# Patient Record
Sex: Female | Born: 1995 | Race: Black or African American | Hispanic: No | Marital: Single
Health system: Southern US, Community
[De-identification: ages and names within clinical notes are randomized; demographics above are authoritative.]

---
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
## 2018-10-23 ENCOUNTER — Emergency Department (HOSPITAL_BASED_OUTPATIENT_CLINIC_OR_DEPARTMENT_OTHER): Payer: 59

## 2018-10-23 ENCOUNTER — Emergency Department (HOSPITAL_BASED_OUTPATIENT_CLINIC_OR_DEPARTMENT_OTHER)
Admission: EM | Admit: 2018-10-23 | Discharge: 2018-10-23 | Disposition: A | Discharge status: Home / Self Care | Payer: 59 | Attending: Emergency Medicine | Admitting: Emergency Medicine

## 2018-10-23 ENCOUNTER — Encounter (HOSPITAL_BASED_OUTPATIENT_CLINIC_OR_DEPARTMENT_OTHER): Payer: Self-pay | Admitting: Emergency Medicine

## 2018-10-23 DIAGNOSIS — E860 Dehydration: Secondary | ICD-10-CM | POA: Diagnosis not present

## 2018-10-23 DIAGNOSIS — E876 Hypokalemia: Secondary | ICD-10-CM | POA: Diagnosis not present

## 2018-10-23 DIAGNOSIS — N10 Acute tubulo-interstitial nephritis: Secondary | ICD-10-CM | POA: Insufficient documentation

## 2018-10-23 DIAGNOSIS — R509 Fever, unspecified: Secondary | ICD-10-CM | POA: Diagnosis present

## 2018-10-23 DIAGNOSIS — N12 Tubulo-interstitial nephritis, not specified as acute or chronic: Secondary | ICD-10-CM

## 2018-10-23 DIAGNOSIS — R112 Nausea with vomiting, unspecified: Secondary | ICD-10-CM | POA: Diagnosis not present

## 2018-10-23 LAB — COMPREHENSIVE METABOLIC PANEL
ALT: 27 U/L (ref 0–44)
AST: 22 U/L (ref 15–41)
Albumin: 2.8 g/dL — ABNORMAL LOW (ref 3.5–5.0)
Alkaline Phosphatase: 94 U/L (ref 38–126)
Anion gap: 16 — ABNORMAL HIGH (ref 5–15)
BUN: 16 mg/dL (ref 6–20)
CO2: 26 mmol/L (ref 22–32)
Calcium: 9 mg/dL (ref 8.9–10.3)
Chloride: 91 mmol/L — ABNORMAL LOW (ref 98–111)
Creatinine, Ser: 1.32 mg/dL — ABNORMAL HIGH (ref 0.44–1.00)
GFR calc Af Amer: >60 mL/min (ref >60)
GFR calc non Af Amer: 58 mL/min — ABNORMAL LOW (ref >60)
Glucose, Bld: 137 mg/dL — ABNORMAL HIGH (ref 70–99)
Potassium: 2.9 mmol/L — ABNORMAL LOW (ref 3.5–5.1)
Sodium: 133 mmol/L — ABNORMAL LOW (ref 135–145)
Total Bilirubin: 1.9 mg/dL — ABNORMAL HIGH (ref 0.3–1.2)
Total Protein: 8 g/dL (ref 6.5–8.1)

## 2018-10-23 LAB — URINALYSIS, ROUTINE W REFLEX MICROSCOPIC
Bilirubin Urine: NEGATIVE
Glucose, UA: NEGATIVE mg/dL
Ketones, ur: 15 mg/dL — AB
Nitrite: POSITIVE — AB
Protein, ur: NEGATIVE mg/dL
Specific Gravity, Urine: <1.005 — ABNORMAL LOW (ref 1.005–1.030)
pH: 6.5 (ref 5.0–8.0)

## 2018-10-23 LAB — CBC WITH DIFFERENTIAL/PLATELET
Abs Immature Granulocytes: 2.16 10*3/uL — ABNORMAL HIGH (ref 0.00–0.07)
Basophils Absolute: 0.2 10*3/uL — ABNORMAL HIGH (ref 0.0–0.1)
Basophils Relative: 1 %
Eosinophils Absolute: 0 10*3/uL (ref 0.0–0.5)
Eosinophils Relative: 0 %
HCT: 33.9 % — ABNORMAL LOW (ref 36.0–46.0)
Hemoglobin: 11.3 g/dL — ABNORMAL LOW (ref 12.0–15.0)
Immature Granulocytes: 6 %
Lymphocytes Relative: 6 %
Lymphs Abs: 2.1 10*3/uL (ref 0.7–4.0)
MCH: 33.1 pg (ref 26.0–34.0)
MCHC: 33.3 g/dL (ref 30.0–36.0)
MCV: 99.4 fL (ref 80.0–100.0)
Monocytes Absolute: 4 10*3/uL — ABNORMAL HIGH (ref 0.1–1.0)
Monocytes Relative: 11 %
Neutro Abs: 28.3 10*3/uL — ABNORMAL HIGH (ref 1.7–7.7)
Neutrophils Relative %: 76 %
Platelets: 332 10*3/uL (ref 150–400)
RBC: 3.41 MIL/uL — ABNORMAL LOW (ref 3.87–5.11)
RDW: 11.8 % (ref 11.5–15.5)
Smear Review: NORMAL
WBC: 36.8 10*3/uL — ABNORMAL HIGH (ref 4.0–10.5)
nRBC: 0 % (ref 0.0–0.2)

## 2018-10-23 LAB — URINALYSIS, MICROSCOPIC (REFLEX)

## 2018-10-23 LAB — PREGNANCY, URINE: Preg Test, Ur: NEGATIVE

## 2018-10-23 LAB — MAGNESIUM: Magnesium: 2.1 mg/dL (ref 1.7–2.4)

## 2018-10-23 MED ORDER — ONDANSETRON 4 MG PO TBDP: 4.0000 mg | ORAL_TABLET | Freq: Three times a day (TID) | ORAL | 0 refills | Status: AC | PRN

## 2018-10-23 MED ORDER — SODIUM CHLORIDE 0.9% FLUSH
3.0000 mL | Freq: Once | INTRAVENOUS | Status: DC
  Filled 2018-10-23: qty 3

## 2018-10-23 MED ORDER — SULFAMETHOXAZOLE-TRIMETHOPRIM 800-160 MG PO TABS: 1.0000 | ORAL_TABLET | Freq: Two times a day (BID) | ORAL | 0 refills | Status: AC

## 2018-10-23 MED ORDER — ONDANSETRON HCL 4 MG/2ML IJ SOLN
INTRAMUSCULAR | Status: AC
  Administered 2018-10-23: 4 mg via INTRAVENOUS
  Filled 2018-10-23: qty 2

## 2018-10-23 MED ORDER — POTASSIUM CHLORIDE 10 MEQ/100ML IV SOLN
10.0000 meq | Freq: Once | INTRAVENOUS | Status: AC
Start: 2018-10-23 — End: 2018-10-23
  Administered 2018-10-23: 10 meq via INTRAVENOUS
  Filled 2018-10-23: qty 100

## 2018-10-23 MED ORDER — ACETAMINOPHEN 500 MG PO TABS
1000.0000 mg | ORAL_TABLET | Freq: Once | ORAL | Status: AC
  Administered 2018-10-23: 1000 mg via ORAL
  Filled 2018-10-23: qty 2

## 2018-10-23 MED ORDER — SODIUM CHLORIDE 0.9 % IV BOLUS
1000.0000 mL | Freq: Once | INTRAVENOUS | Status: AC
  Administered 2018-10-23: 07:00:00 1000 mL via INTRAVENOUS

## 2018-10-23 MED ORDER — ONDANSETRON HCL 4 MG/2ML IJ SOLN
4.0000 mg | Freq: Once | INTRAMUSCULAR | Status: AC
  Administered 2018-10-23: 07:00:00 4 mg via INTRAVENOUS

## 2018-10-23 MED ORDER — SODIUM CHLORIDE 0.9 % IV SOLN
1.0000 g | Freq: Once | INTRAVENOUS | Status: AC
  Administered 2018-10-23: 1 g via INTRAVENOUS
  Filled 2018-10-23: qty 10

## 2018-10-23 MED ORDER — SODIUM CHLORIDE 0.9 % IV BOLUS
1000.0000 mL | Freq: Once | INTRAVENOUS | Status: AC
  Administered 2018-10-23: 10:00:00 1000 mL via INTRAVENOUS

## 2018-10-23 MED ORDER — IOHEXOL 300 MG/ML  SOLN
100.0000 mL | Freq: Once | INTRAMUSCULAR | Status: AC | PRN
  Administered 2018-10-23: 08:00:00 100 mL via INTRAVENOUS

## 2018-10-23 NOTE — ED Notes (Signed)
Patient transported to X-ray 

## 2018-10-23 NOTE — ED Provider Notes (Signed)
[facility] Provider Note: Georgena Spurling, MD, FACEP  CSN: [id number] MRN: [id number] ARRIVAL: 10/23/18 at [facility]: MHOTF/OTF   CHIEF COMPLAINT  Generalized Body Aches   HISTORY OF PRESENT ILLNESS  10/23/18 6:45 AM Cheryl Ruiz is a 23 y.o. female who has been sick with fever (as high as 102.7 here), chills but, generalized body aches, nausea and vomiting for 9 days.  She has been treating her fever with Tylenol but has had difficulty keeping it down.  Her generalized body aches are most prominent right now in the right flank.  She rates his pain as a 9 out of 10.  It is not worse with movement or palpation.  She denies nasal congestion, sore throat, cough, shortness of breath, abdominal pain or diarrhea.  She was tested for COVID-19 in the past week and was reportedly negative.    History reviewed. No pertinent past medical history.  History reviewed. No pertinent surgical history.  History reviewed. No pertinent family history.  Social History   Tobacco Use   Smoking status: Passive Smoke Exposure - Never Smoker   Smokeless tobacco: Never Used  Substance Use Topics   Alcohol use: Yes    Comment: occ    Drug use: Yes    Types: Marijuana    Prior to Admission medications   Medication Sig Start Date End Date Taking? Authorizing Provider  ibuprofen (ADVIL,MOTRIN) 400 MG tablet Take 1 tablet (400 mg total) by mouth every 6 (six) hours as needed. 05/31/15   Dorie Rank, MD  ondansetron (ZOFRAN ODT) 4 MG disintegrating tablet Take 1 tablet (4 mg total) by mouth every 8 (eight) hours as needed. 10/23/18   Isla Pence, MD  sulfamethoxazole-trimethoprim (BACTRIM DS) 800-160 MG tablet Take 1 tablet by mouth 2 (two) times daily for 7 days. 10/23/18 10/30/18  Isla Pence, MD    Allergies Patient has no known allergies.   REVIEW OF SYSTEMS  Negative except as noted here or in the History of Present Illness.   PHYSICAL EXAMINATION  Initial Vital Signs Blood  pressure 114/70, pulse (!) 125, temperature (!) 102.7 F (39.3 C), temperature source Oral, resp. rate 18, height 5\' 7"  (1.702 m), weight 66.7 kg, last menstrual period 10/09/2018, SpO2 97 %.  Examination General: Well-developed, well-nourished female in no acute distress; appearance consistent with age of record HENT: normocephalic; atraumatic Eyes: pupils equal, round and reactive to light; extraocular muscles intact Neck: supple Heart: regular rate and rhythm; tachycardia Lungs: clear to auscultation bilaterally Abdomen: soft; nondistended; nontender; no masses or hepatosplenomegaly; bowel sounds present GU: No CVA tenderness Extremities: No deformity; full range of motion; pulses normal Neurologic: Awake, alert and oriented; motor function intact in all extremities and symmetric; no facial droop Skin: Warm and dry Psychiatric: Normal mood and affect   RESULTS  Summary of this visit's results, reviewed by myself:   EKG Interpretation  Date/Time:    Ventricular Rate:    PR Interval:    QRS Duration:   QT Interval:    QTC Calculation:   R Axis:     Text Interpretation:        Laboratory Studies: Results for orders placed or performed during the hospital encounter of 10/23/18 (from the past 24 hour(s))  Comprehensive metabolic panel     Status: Abnormal   Collection Time: 10/23/18  6:38 AM  Result Value Ref Range   Sodium 133 (L) 135 - 145 mmol/L   Potassium 2.9 (L) 3.5 - 5.1 mmol/L   Chloride  91 (L) 98 - 111 mmol/L   CO2 26 22 - 32 mmol/L   Glucose, Bld 137 (H) 70 - 99 mg/dL   BUN 16 6 - 20 mg/dL   Creatinine, Ser 1.27 (H) 0.44 - 1.00 mg/dL   Calcium 9.0 8.9 - 51.7 mg/dL   Total Protein 8.0 6.5 - 8.1 g/dL   Albumin 2.8 (L) 3.5 - 5.0 g/dL   AST 22 15 - 41 U/L   ALT 27 0 - 44 U/L   Alkaline Phosphatase 94 38 - 126 U/L   Total Bilirubin 1.9 (H) 0.3 - 1.2 mg/dL   GFR calc non Af Amer 58 (L) >60 mL/min   GFR calc Af Amer >60 >60 mL/min   Anion gap 16 (H) 5 - 15    CBC with Differential     Status: Abnormal   Collection Time: 10/23/18  6:38 AM  Result Value Ref Range   WBC 36.8 (H) 4.0 - 10.5 K/uL   RBC 3.41 (L) 3.87 - 5.11 MIL/uL   Hemoglobin 11.3 (L) 12.0 - 15.0 g/dL   HCT 00.1 (L) 74.9 - 44.9 %   MCV 99.4 80.0 - 100.0 fL   MCH 33.1 26.0 - 34.0 pg   MCHC 33.3 30.0 - 36.0 g/dL   RDW 67.5 91.6 - 38.4 %   Platelets 332 150 - 400 K/uL   nRBC 0.0 0.0 - 0.2 %   Neutrophils Relative % 76 %   Neutro Abs 28.3 (H) 1.7 - 7.7 K/uL   Lymphocytes Relative 6 %   Lymphs Abs 2.1 0.7 - 4.0 K/uL   Monocytes Relative 11 %   Monocytes Absolute 4.0 (H) 0.1 - 1.0 K/uL   Eosinophils Relative 0 %   Eosinophils Absolute 0.0 0.0 - 0.5 K/uL   Basophils Relative 1 %   Basophils Absolute 0.2 (H) 0.0 - 0.1 K/uL   RBC Morphology MORPHOLOGY UNREMARKABLE    Smear Review Normal platelet morphology    Immature Granulocytes 6 %   Abs Immature Granulocytes 2.16 (H) 0.00 - 0.07 K/uL  Magnesium     Status: None   Collection Time: 10/23/18  6:38 AM  Result Value Ref Range   Magnesium 2.1 1.7 - 2.4 mg/dL  Urinalysis, Routine w reflex microscopic     Status: Abnormal   Collection Time: 10/23/18  9:30 AM  Result Value Ref Range   Color, Urine YELLOW YELLOW   APPearance HAZY (A) CLEAR   Specific Gravity, Urine <1.005 (L) 1.005 - 1.030   pH 6.5 5.0 - 8.0   Glucose, UA NEGATIVE NEGATIVE mg/dL   Hgb urine dipstick MODERATE (A) NEGATIVE   Bilirubin Urine NEGATIVE NEGATIVE   Ketones, ur 15 (A) NEGATIVE mg/dL   Protein, ur NEGATIVE NEGATIVE mg/dL   Nitrite POSITIVE (A) NEGATIVE   Leukocytes,Ua TRACE (A) NEGATIVE  Pregnancy, urine     Status: None   Collection Time: 10/23/18  9:30 AM  Result Value Ref Range   Preg Test, Ur NEGATIVE NEGATIVE  Urinalysis, Microscopic (reflex)     Status: Abnormal   Collection Time: 10/23/18  9:30 AM  Result Value Ref Range   RBC / HPF 0-5 0 - 5 RBC/hpf   WBC, UA 21-50 0 - 5 WBC/hpf   Bacteria, UA MANY (A) NONE SEEN   Squamous Epithelial /  LPF 0-5 0 - 5   WBC Clumps PRESENT    Imaging Studies: Dg Chest 2 View  Result Date: 10/23/2018 CLINICAL DATA:  Generalized body aches and fever.  Chills. EXAM: CHEST - 2 VIEW COMPARISON:  None. FINDINGS: The heart size and mediastinal contours are within normal limits. Both lungs are clear. The visualized skeletal structures are unremarkable. IMPRESSION: Negative two view chest x-ray Electronically Signed   By: Marin Robertshristopher  Mattern M.D.   On: 10/23/2018 07:41   Ct Abdomen Pelvis W Contrast  Result Date: 10/23/2018 CLINICAL DATA:  Diffuse abdominal pain, chills, and vomiting for 1 week. EXAM: CT ABDOMEN AND PELVIS WITH CONTRAST TECHNIQUE: Multidetector CT imaging of the abdomen and pelvis was performed using the standard protocol following bolus administration of intravenous contrast. CONTRAST:  100mL OMNIPAQUE IOHEXOL 300 MG/ML  SOLN COMPARISON:  None. FINDINGS: Lower Chest: No acute findings. Hepatobiliary: A few tiny subcentimeter low attention lesions are too small to characterize, but most likely represent tiny cysts. No definite liver masses are identified. Gallbladder is unremarkable. No evidence of biliary ductal dilatation. Pancreas:  No mass or inflammatory changes. Spleen: Within normal limits in size and appearance. Adrenals/Urinary Tract: Multiple poorly defined areas of decreased parenchymal enhancement are seen involving the right kidney greater than left, with associated perinephric inflammatory changes. These findings are consistent with bilateral pyelonephritis. A few small renal cysts are noted bilaterally, however there is no definite evidence of renal abscess. No evidence of hydronephrosis. Unremarkable unopacified urinary bladder. Stomach/Bowel: No evidence of obstruction, inflammatory process or abnormal fluid collections. Vascular/Lymphatic: No pathologically enlarged lymph nodes. No abdominal aortic aneurysm. Reproductive:  No mass or other significant abnormality. Other:  None.  Musculoskeletal:  No suspicious bone lesions identified. IMPRESSION: Bilateral pyelonephritis, right side greater than left. No evidence of hydronephrosis or abscess. Electronically Signed   By: Danae OrleansJohn A Stahl M.D.   On: 10/23/2018 09:05    ED COURSE and MDM  Nursing notes and initial vitals signs, including pulse oximetry, reviewed.  Vitals:   10/23/18 0619 10/23/18 0621 10/23/18 1044  BP: 114/70  100/65  Pulse: (!) 125  90  Resp: 18  18  Temp: (!) 102.7 F (39.3 C)  98.6 F (37 C)  TempSrc: Oral  Oral  SpO2: 97%  99%  Weight:  66.7 kg   Height:  5\' 7"  (1.702 m)    6:52 AM IV fluid bolus started.  Zofran IV and acetaminophen orally ordered.  7:00 AM Signed out to Dr. Con MemosHavilland.  PROCEDURES    ED DIAGNOSES     ICD-10-CM   1. Fever in adult  R50.9   2. Pyelonephritis  N12   3. Dehydration  E86.0   4. Non-intractable vomiting with nausea, unspecified vomiting type  R11.2   5. Hypokalemia  E87.6        Stefen Juba, Jonny RuizJohn, MD 10/23/18 2236

## 2018-10-23 NOTE — ED Triage Notes (Signed)
Pt is c/o generalized body aches and vomiting that started last Monday  Pt states she is also having chills  Pt states she has already been tested for COVID and the results were negative  Pt states her back on the right side is what is hurting her most now

## 2018-10-23 NOTE — ED Notes (Signed)
Patient transported to CT 

## 2018-10-23 NOTE — ED Provider Notes (Signed)
Pt signed out by Dr. Florina Ou pending labs and work up.  Pt has an elevated WBC count with right flank pain, so I ordered a CT.  IMPRESSION:  Bilateral pyelonephritis, right side greater than left. No evidence  of hydronephrosis or abscess.     Pt given IV rocephin for the pyelo.  Pt also hypokalemic, so pt given 10 meq Kcl IV.    She is feeling much better after 2L and meds.  She looks great.  No signs of sepsis.  Urine sent for cx.  She is able to tolerate po fluids.  She will be d/c home with zofran and bactrim.  She is told to alternate tylenol/ibuprofen for fever.   She knows to return if worse.   Isla Pence, MD 10/23/18 1046

## 2018-10-25 LAB — URINE CULTURE: Culture: >=100000 — AB

## 2018-10-26 ENCOUNTER — Telehealth: Payer: Self-pay

## 2018-10-26 NOTE — Telephone Encounter (Signed)
Post ED Visit - Positive Culture Follow-up  Culture report reviewed by antimicrobial stewardship pharmacist: [facility] Team []  Elenor Quinones, Pharm.D. []  Heide Guile, Pharm.D., BCPS AQ-ID []  Parks Neptune, Pharm.D., BCPS []  Alycia Rossetti, Pharm.D., BCPS []  [place], [place].D., BCPS, AAHIVP []  Legrand Como, Pharm.D., BCPS, AAHIVP []  Salome Arnt, PharmD, BCPS []  Johnnette Gourd, PharmD, BCPS [x]  Hughes Better, PharmD, BCPS []  Leeroy Cha, PharmD []  Laqueta Linden, PharmD, BCPS []  Albertina Parr, PharmD  [place] Team []  Leodis Sias, PharmD []  Lindell Spar, PharmD []  Royetta Asal, PharmD []  Graylin Shiver, Rph []  Rema Fendt) Glennon Mac, PharmD []  Arlyn Dunning, PharmD []  Netta Cedars, PharmD []  Dia Sitter, PharmD []  Leone Haven, PharmD []  Gretta Arab, PharmD []  Theodis Shove, PharmD []  Peggyann Juba, PharmD []  Reuel Boom, PharmD   Positive urine culture Treated with BactrimDS, organism sensitive to the same and no further patient follow-up is required at this time.  Genia Del 10/26/2018, 11:45 AM

## 2021-02-12 IMAGING — DX DG CHEST 2V
2 series · 2 of 2 positions shown · non-contrast
Comparison: None.

CLINICAL DATA: Generalized body aches and fever.  Chills.

EXAM:
CHEST - 2 VIEW

[chest pa]
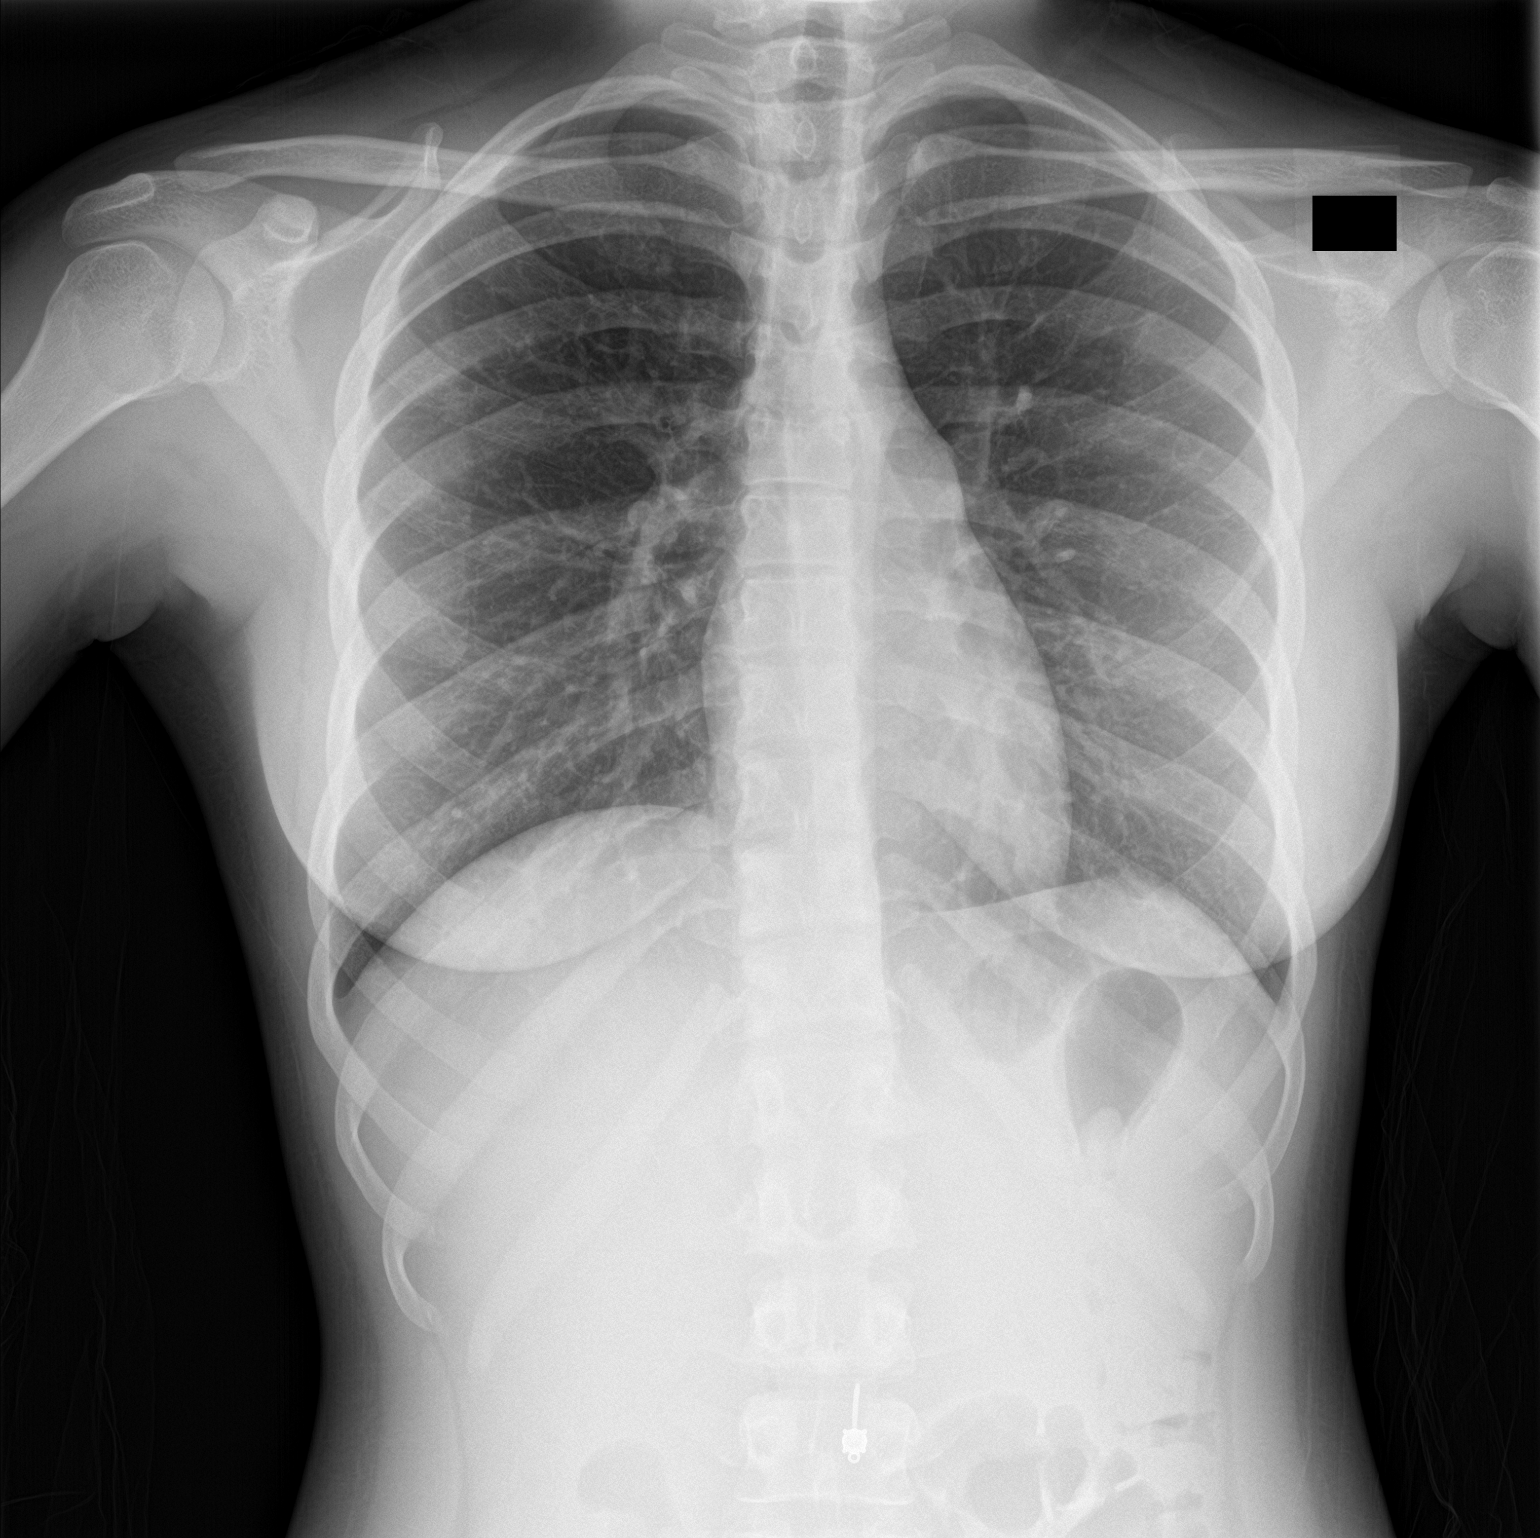

[chest lat]
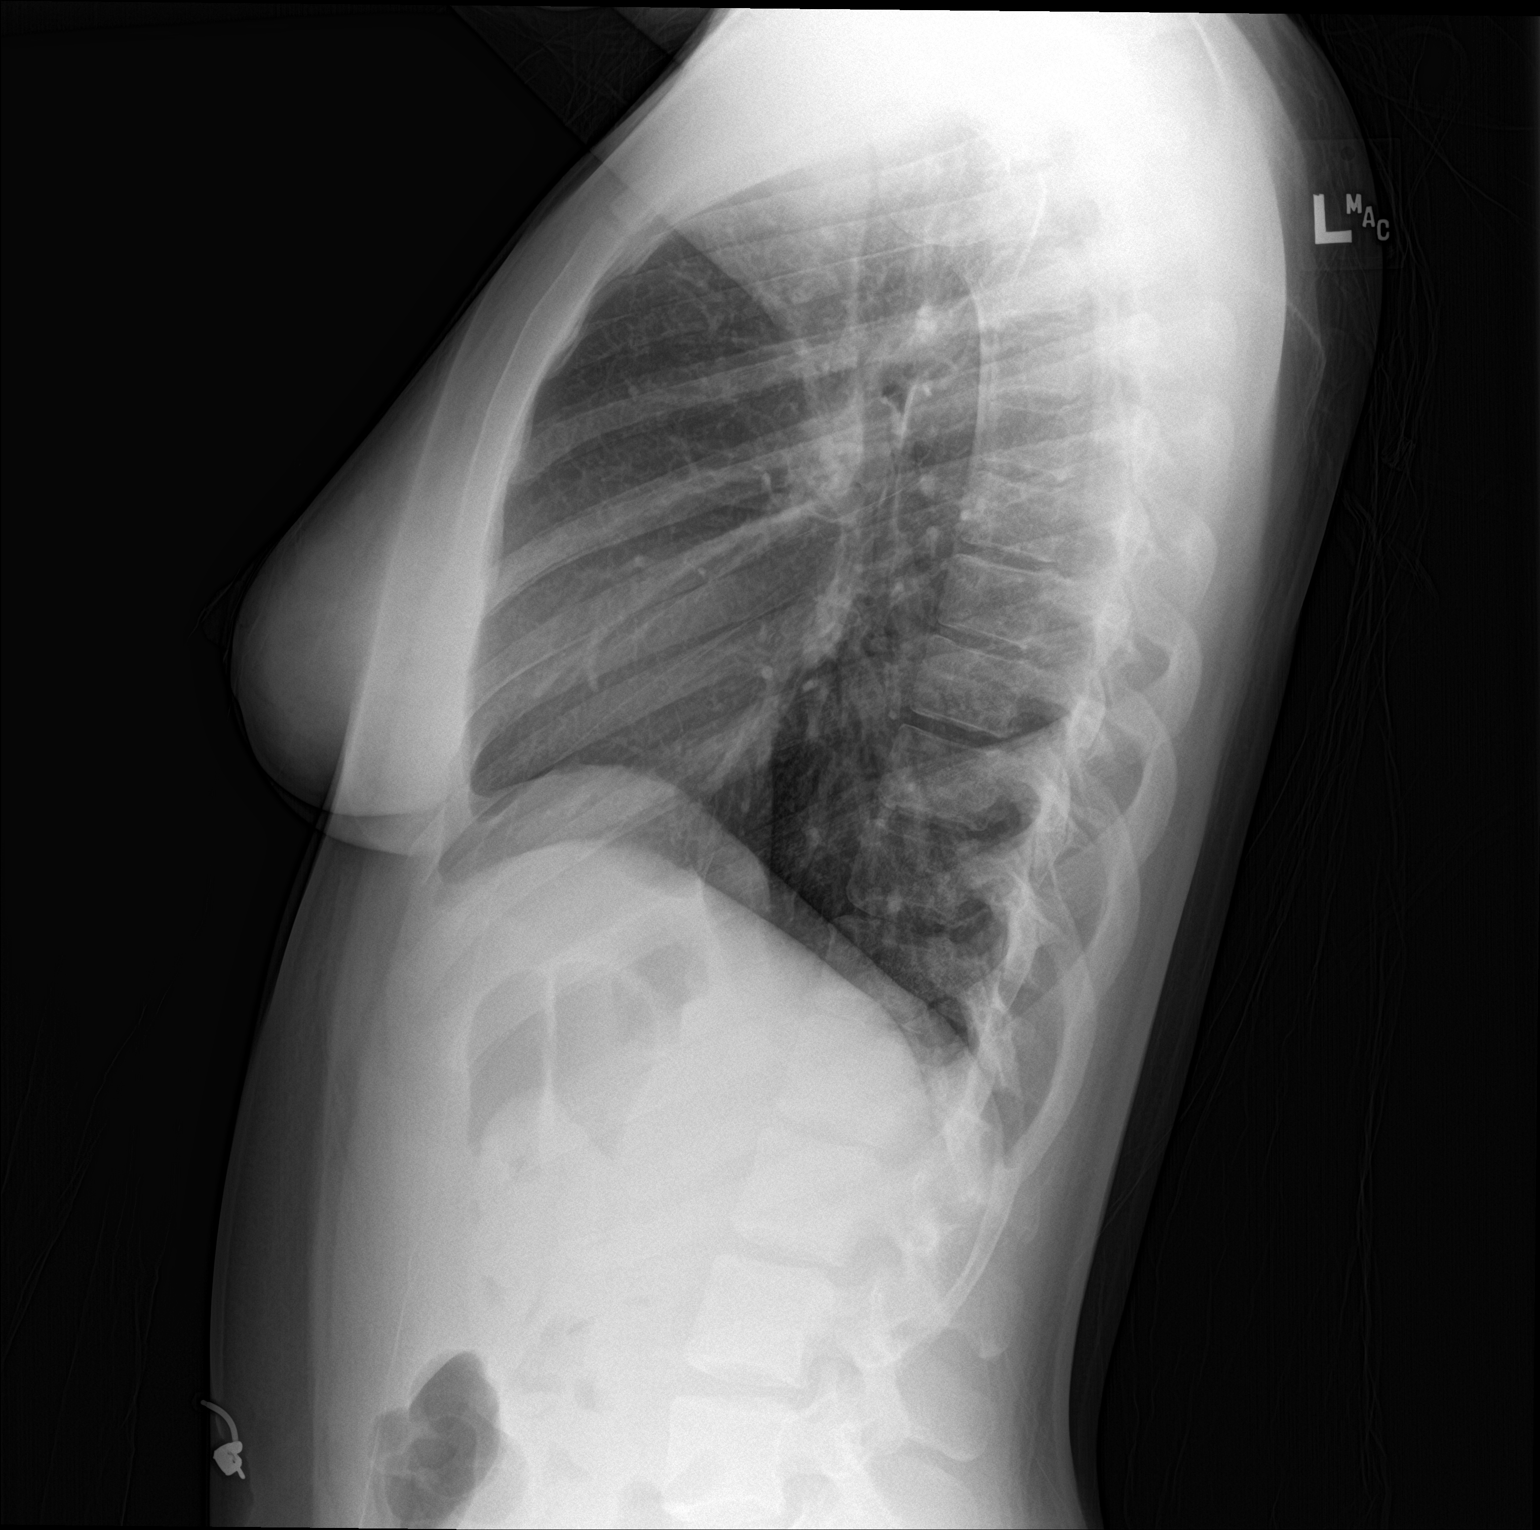

[2 of 2 positions shown; findings below may reference images not displayed]

FINDINGS: The heart size and mediastinal contours are within normal limits.
Both lungs are clear. The visualized skeletal structures are
unremarkable.
IMPRESSION: Negative two view chest x-ray

## 2021-02-12 IMAGING — CT CT ABD-PELV W/ CM
2 of 4 series · 16 of 46 positions shown, 18 images · IV contrast (omnipaque)
Comparison: None.

CLINICAL DATA: Diffuse abdominal pain, chills, and vomiting for 1
week.

EXAM:
CT ABDOMEN AND PELVIS WITH CONTRAST
TECHNIQUE: Multidetector CT imaging of the abdomen and pelvis was performed
using the standard protocol following bolus administration of
intravenous contrast.
CONTRAST:  100mL OMNIPAQUE IOHEXOL 300 MG/ML  SOLN

[Series 2: axial st · axial · 0.89mm/px · z∈[-451,-21]mm · 13 of 94 slices shown, 15 images]
[im 4/94  soft-tissue]
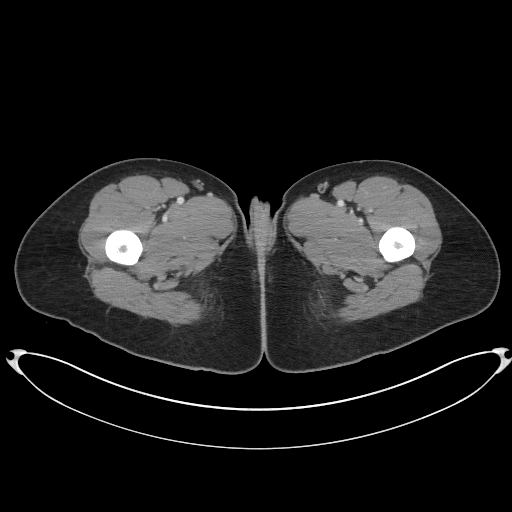
[im 4/94  bone]
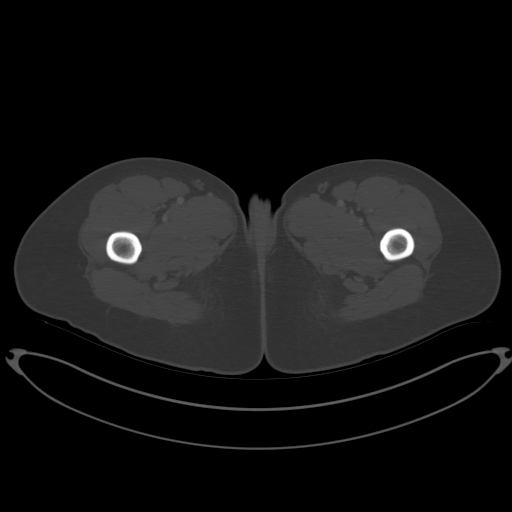
[im 12/94  soft-tissue]
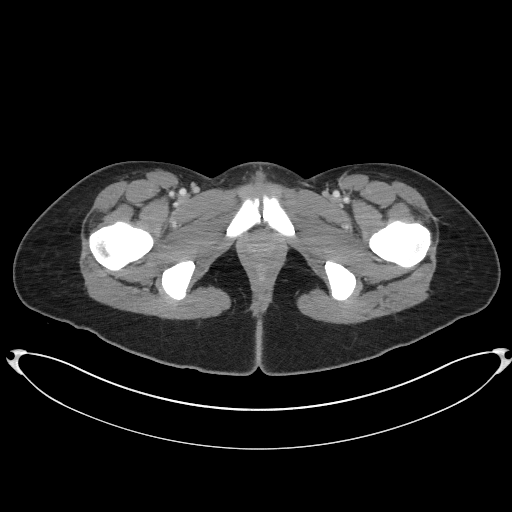
[im 20/94  soft-tissue]
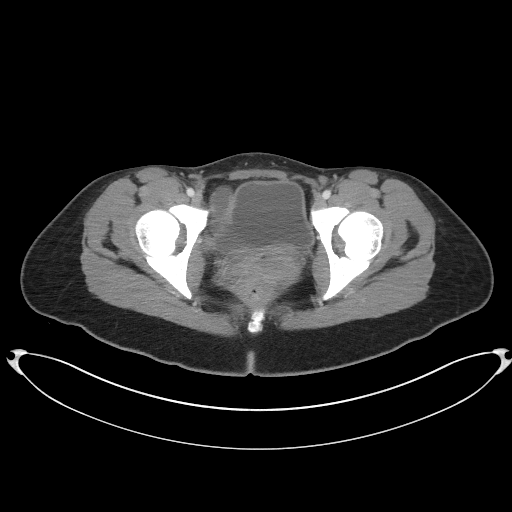
[im 28/94  soft-tissue]
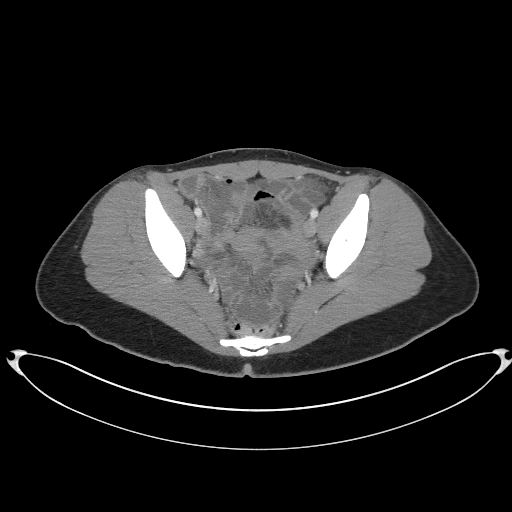
[im 32/94  soft-tissue]
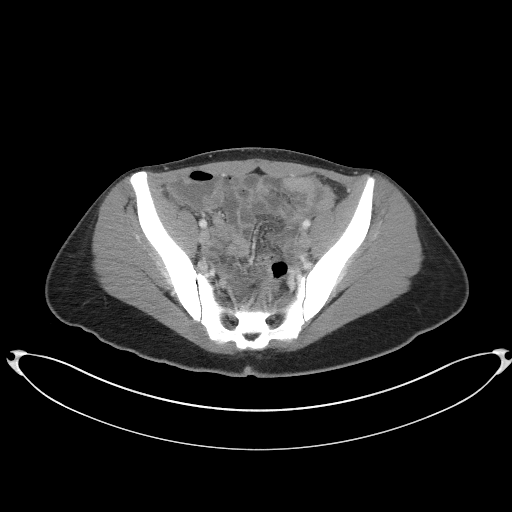
[im 39/94  soft-tissue]
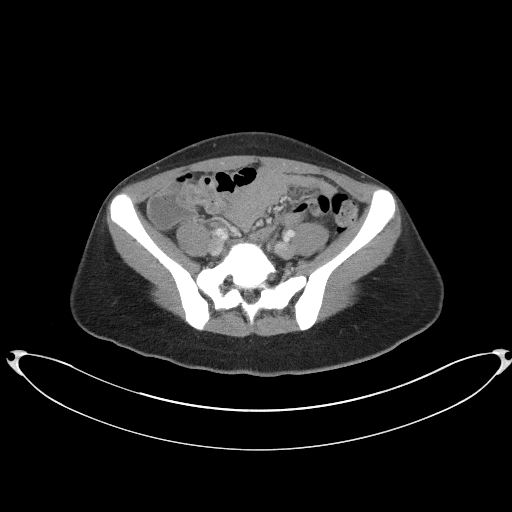
[im 47/94  soft-tissue]
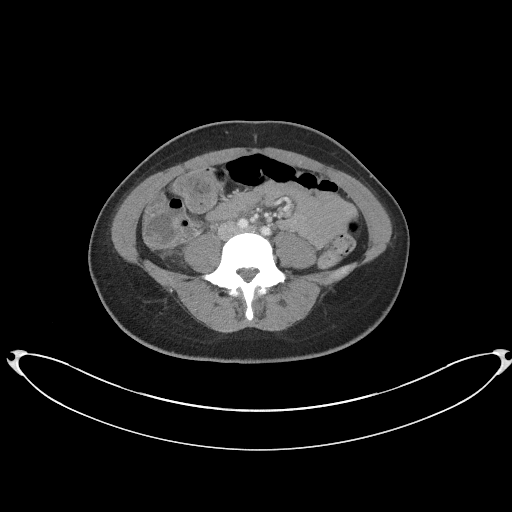
[im 55/94  soft-tissue]
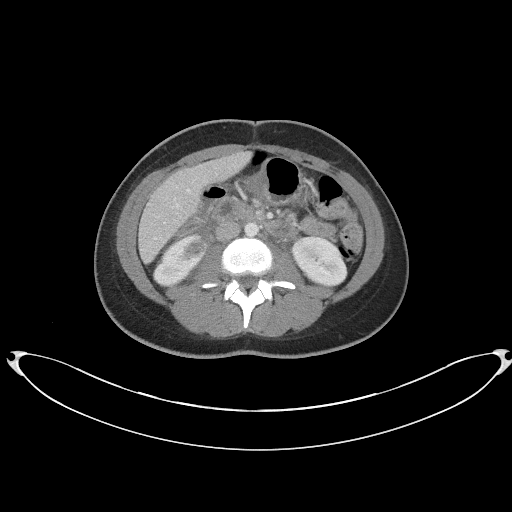
[im 63/94  soft-tissue]
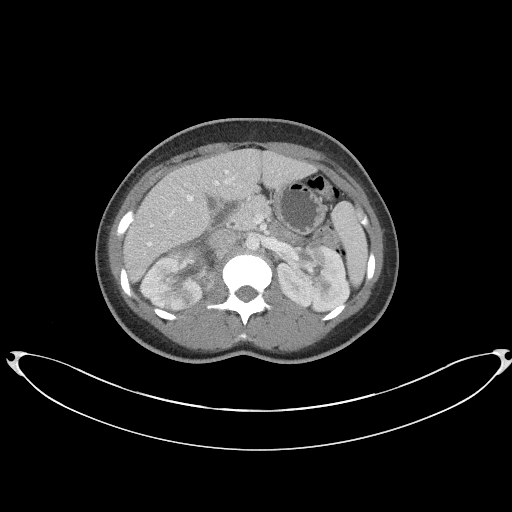
[im 63/94  bone]
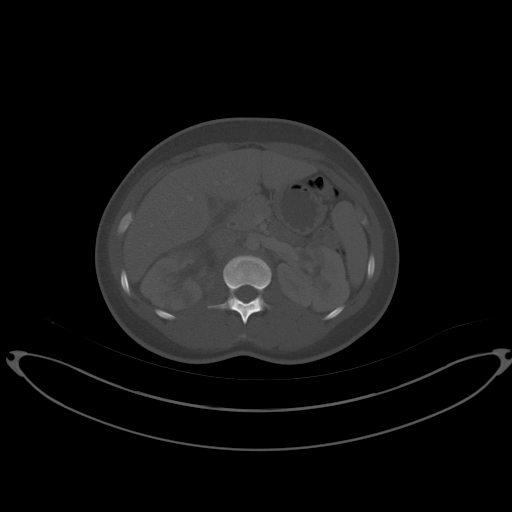
[im 66/94  soft-tissue]
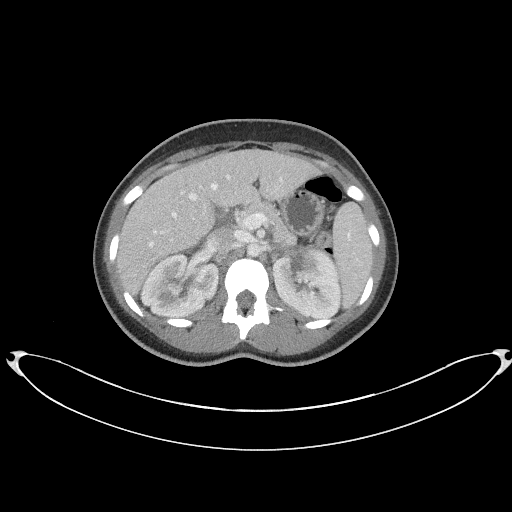
[im 74/94  soft-tissue]
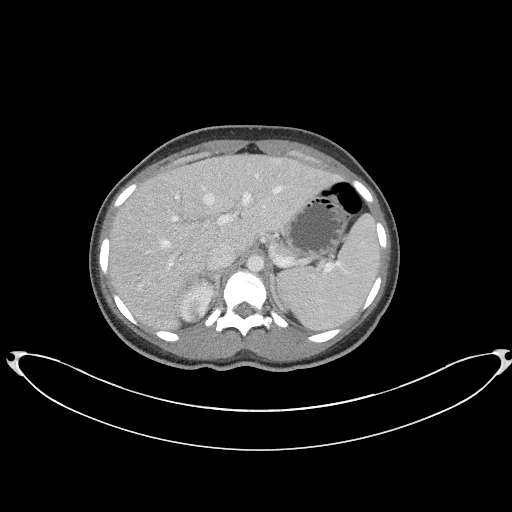
[im 82/94  soft-tissue]
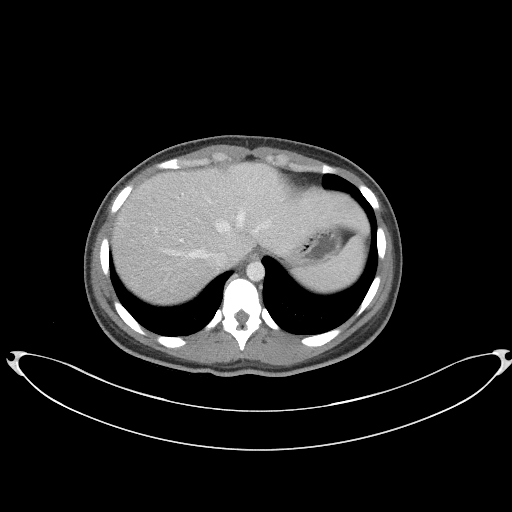
[im 90/94  soft-tissue]
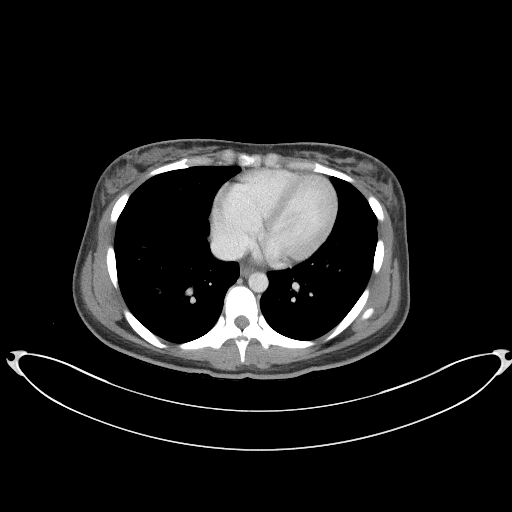

[Series 5: coronal st · coronal · 0.80mm/px · 3 of 80 slices shown]
[im 27/80  soft-tissue]
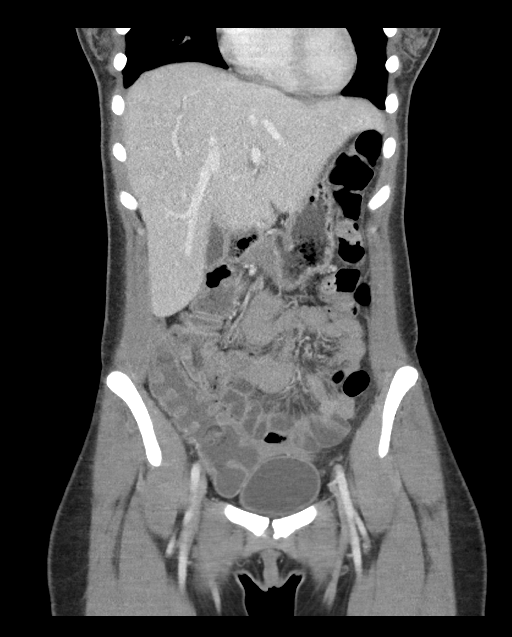
[im 36/80  soft-tissue]
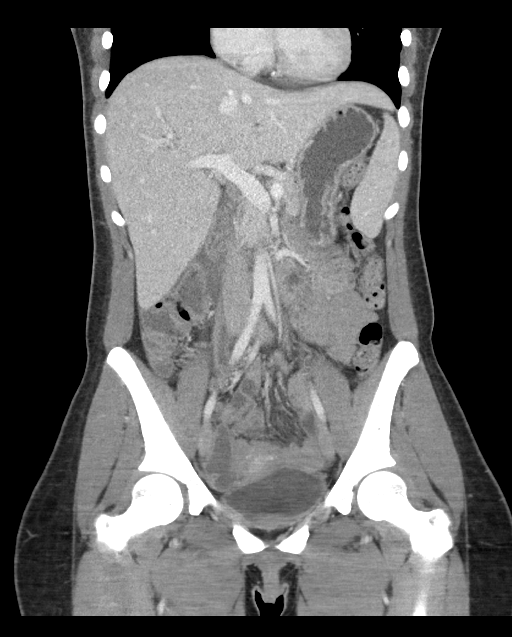
[im 44/80  soft-tissue]
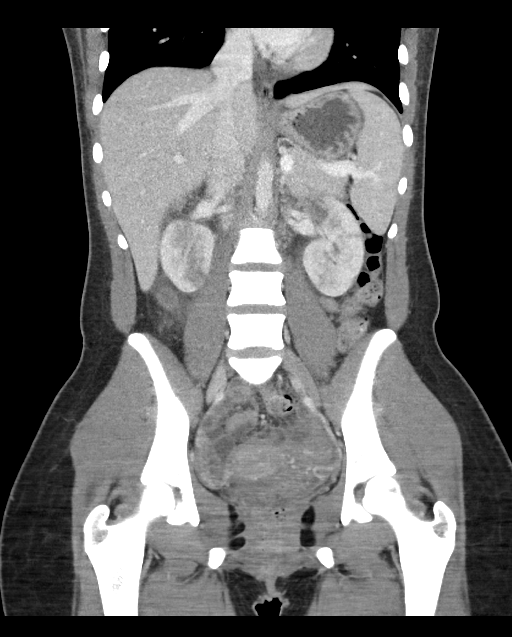

[16 of 46 positions shown; findings below may reference images not displayed]

FINDINGS: Lower Chest: No acute findings.

Hepatobiliary: A few tiny subcentimeter low attention lesions are
too small to characterize, but most likely represent tiny cysts. No
definite liver masses are identified. Gallbladder is unremarkable.
No evidence of biliary ductal dilatation.

Pancreas:  No mass or inflammatory changes.

Spleen: Within normal limits in size and appearance.

Adrenals/Urinary Tract: Multiple poorly defined areas of decreased
parenchymal enhancement are seen involving the right kidney greater
than left, with associated perinephric inflammatory changes. These
findings are consistent with bilateral pyelonephritis. A few small
renal cysts are noted bilaterally, however there is no definite
evidence of renal abscess. No evidence of hydronephrosis.
Unremarkable unopacified urinary bladder.

Stomach/Bowel: No evidence of obstruction, inflammatory process or
abnormal fluid collections.

Vascular/Lymphatic: No pathologically enlarged lymph nodes. No
abdominal aortic aneurysm.

Reproductive:  No mass or other significant abnormality.

Other:  None.

Musculoskeletal:  No suspicious bone lesions identified.
IMPRESSION: Bilateral pyelonephritis, right side greater than left. No evidence
of hydronephrosis or abscess.
